# Patient Record
Sex: Male | Born: 1985 | Race: Black or African American | Hispanic: No | Marital: Single | Smoking: Never smoker
Health system: Southern US, Community
[De-identification: ages and names within clinical notes are randomized; demographics above are authoritative.]

---

## 2017-11-19 ENCOUNTER — Ambulatory Visit (HOSPITAL_COMMUNITY)
Admission: EM | Admit: 2017-11-19 | Discharge: 2017-11-19 | Disposition: A | Discharge status: Home / Self Care | Payer: BLUE CROSS/BLUE SHIELD | Attending: Family Medicine | Admitting: Family Medicine

## 2017-11-19 ENCOUNTER — Encounter (HOSPITAL_COMMUNITY): Payer: Self-pay | Admitting: Family Medicine

## 2017-11-19 DIAGNOSIS — R3912 Poor urinary stream: Secondary | ICD-10-CM | POA: Insufficient documentation

## 2017-11-19 DIAGNOSIS — R339 Retention of urine, unspecified: Secondary | ICD-10-CM

## 2017-11-19 DIAGNOSIS — N50811 Right testicular pain: Secondary | ICD-10-CM

## 2017-11-19 DIAGNOSIS — R3989 Other symptoms and signs involving the genitourinary system: Secondary | ICD-10-CM

## 2017-11-19 LAB — POCT URINALYSIS DIP (DEVICE)
Bilirubin Urine: NEGATIVE
GLUCOSE, UA: NEGATIVE mg/dL
Ketones, ur: NEGATIVE mg/dL
Leukocytes, UA: NEGATIVE
Nitrite: NEGATIVE
PH: 6 (ref 5.0–8.0)
PROTEIN: NEGATIVE mg/dL
UROBILINOGEN UA: 1 mg/dL (ref 0.0–1.0)

## 2017-11-19 LAB — PSA: Prostatic Specific Antigen: 0.82 ng/mL (ref 0.00–4.00)

## 2017-11-19 NOTE — ED Provider Notes (Signed)
[facility]    CSN: [id number] Arrival date & time: 11/19/17  [id number]     History   Chief Complaint Chief Complaint  Patient presents with  . Urinary Retention    HPI Dylan Kirk is a 31 y.o. male presenting with sensation of incomplete voiding, weaker stream and an odd sensation/pain in his right testicle. States symptoms have been occurring for 2 weeks and have been intermittent but have become more constant and more aware of symptoms of recently. Sexually active in relationship with 1 partner. Trying to have kids. No dysuria, no discharge. Endorsing lower back pain.    HPI  History reviewed. No pertinent past medical history.  There are no active problems to display for this patient.   History reviewed. No pertinent surgical history.     Home Medications    Prior to Admission medications   Not on File    Family History History reviewed. No pertinent family history.  Social History Social History   Tobacco Use  . Smoking status: Never Smoker  . Smokeless tobacco: Never Used  Substance Use Topics  . Alcohol use: Not on file  . Drug use: Not on file     Allergies   Patient has no known allergies.   Review of Systems Review of Systems  Constitutional: Negative for fever.  Gastrointestinal: Negative for diarrhea, nausea and vomiting.  Genitourinary: Positive for difficulty urinating and testicular pain. Negative for dysuria, hematuria, penile pain and scrotal swelling.  Musculoskeletal: Positive for back pain.  Neurological: Negative for dizziness, light-headedness and headaches.     Physical Exam Triage Vital Signs ED Triage Vitals  Enc Vitals Group     BP 11/19/17 1927 (!) 146/87     Pulse Rate 11/19/17 1927 85     Resp 11/19/17 1927 18     Temp 11/19/17 1927 98.4 F (36.9 C)     Temp src --      SpO2 11/19/17 1927 100 %     Weight --      Height --      Head Circumference --      Peak Flow --      Pain Score 11/19/17 1924  3     Pain Loc --      Pain Edu? --      Excl. in GC? --    No data found.  Updated Vital Signs BP (!) 146/87   Pulse 85   Temp 98.4 F (36.9 C)   Resp 18   SpO2 100%   Visual Acuity Right Eye Distance:   Left Eye Distance:   Bilateral Distance:    Right Eye Near:   Left Eye Near:    Bilateral Near:     Physical Exam  Constitutional: He is oriented to person, place, and time. He appears well-developed and well-nourished.  HENT:  Head: Normocephalic and atraumatic.  Eyes: Conjunctivae are normal.  Neck: Neck supple.  Cardiovascular: Normal rate and regular rhythm.  No murmur heard. Pulmonary/Chest: Effort normal and breath sounds normal. No respiratory distress.  Abdominal: Soft. There is no tenderness.  Genitourinary:  Genitourinary Comments: No penile discharge in urethra. No penile swelling or pain. No scrotal swelling, no tenderness to epididymitis. No masses in testicles.   Musculoskeletal: He exhibits no edema.  Neurological: He is alert and oriented to person, place, and time.  Skin: Skin is warm and dry.  Psychiatric: He has a normal mood and affect.  Nursing note and vitals reviewed.  UC Treatments / Results  Labs (all labs ordered are listed, but only abnormal results are displayed) Labs Reviewed  POCT URINALYSIS DIP (DEVICE) - Abnormal; Notable for the following components:      Result Value   Hgb urine dipstick MODERATE (*)    All other components within normal limits  URINE CULTURE  PSA  URINE CYTOLOGY ANCILLARY ONLY    EKG  EKG Interpretation None       Radiology No results found.  Procedures Procedures (including critical care time)  Medications Ordered in UC Medications - No data to display   Initial Impression / Assessment and Plan / UC Course  I have reviewed the triage vital signs and the nursing notes.  Pertinent labs & imaging results that were available during my care of the patient were reviewed by me and considered  in my medical decision making (see chart for details).     No evidence of infection on urine today. No evidence of torsion on exam. Checking for STD's and urine culture. PSA drawn. Follow up with urology.   Final Clinical Impressions(s) / UC Diagnoses   Final diagnoses:  Weak urinary stream  Urinary problem    ED Discharge Orders    None       Controlled Substance Prescriptions [place] Controlled Substance Registry consulted? Not Applicable   Foy GuadalajaraWieters, Skanda Worlds C, PA-C 11/19/17 2058    Lew DawesWieters, Shenay Torti C, PA-C 11/20/17 1007

## 2017-11-19 NOTE — Discharge Instructions (Signed)
Weak stream and sensation of incomplete voiding are concerning for prostate issues- we are checking for PSA  No evidence of infection on your urine today- we are checking for STD's and sending urine for culture to see if any bacteria come out.   We will call you to let you know if anything abnormal comes back on results.   Please follow up with urology for further evaluation.

## 2017-11-19 NOTE — ED Triage Notes (Signed)
Pt here with urinary retention. sts also pain in right testicle. Denies dysuria discharge or hematuria.

## 2017-11-20 LAB — URINE CYTOLOGY ANCILLARY ONLY
CHLAMYDIA, DNA PROBE: NEGATIVE
NEISSERIA GONORRHEA: NEGATIVE
TRICH (WINDOWPATH): NEGATIVE

## 2017-11-21 LAB — URINE CULTURE: Culture: NO GROWTH

## 2020-12-08 ENCOUNTER — Other Ambulatory Visit: Payer: BLUE CROSS/BLUE SHIELD

## 2021-04-16 ENCOUNTER — Observation Stay (HOSPITAL_BASED_OUTPATIENT_CLINIC_OR_DEPARTMENT_OTHER)
Admission: EM | Admit: 2021-04-16 | Discharge: 2021-04-18 | Disposition: A | Discharge status: Home / Self Care | Payer: No Typology Code available for payment source | Attending: Surgery | Admitting: Surgery

## 2021-04-16 ENCOUNTER — Emergency Department (HOSPITAL_BASED_OUTPATIENT_CLINIC_OR_DEPARTMENT_OTHER): Payer: No Typology Code available for payment source

## 2021-04-16 ENCOUNTER — Other Ambulatory Visit: Payer: Self-pay

## 2021-04-16 ENCOUNTER — Encounter (HOSPITAL_BASED_OUTPATIENT_CLINIC_OR_DEPARTMENT_OTHER): Payer: Self-pay

## 2021-04-16 DIAGNOSIS — K82A2 Perforation of gallbladder in cholecystitis: Secondary | ICD-10-CM | POA: Diagnosis not present

## 2021-04-16 DIAGNOSIS — K819 Cholecystitis, unspecified: Secondary | ICD-10-CM | POA: Diagnosis present

## 2021-04-16 DIAGNOSIS — R109 Unspecified abdominal pain: Secondary | ICD-10-CM

## 2021-04-16 DIAGNOSIS — K8000 Calculus of gallbladder with acute cholecystitis without obstruction: Secondary | ICD-10-CM | POA: Diagnosis not present

## 2021-04-16 DIAGNOSIS — Z20822 Contact with and (suspected) exposure to covid-19: Secondary | ICD-10-CM | POA: Insufficient documentation

## 2021-04-16 DIAGNOSIS — K810 Acute cholecystitis: Secondary | ICD-10-CM | POA: Diagnosis present

## 2021-04-16 LAB — CBC WITH DIFFERENTIAL/PLATELET
Abs Immature Granulocytes: 0.04 10*3/uL (ref 0.00–0.07)
Basophils Absolute: 0 10*3/uL (ref 0.0–0.1)
Basophils Relative: 0 %
Eosinophils Absolute: 0 10*3/uL (ref 0.0–0.5)
Eosinophils Relative: 0 %
HCT: 43.9 % (ref 39.0–52.0)
Hemoglobin: 14.5 g/dL (ref 13.0–17.0)
Immature Granulocytes: 0 %
Lymphocytes Relative: 11 %
Lymphs Abs: 1.9 10*3/uL (ref 0.7–4.0)
MCH: 30.2 pg (ref 26.0–34.0)
MCHC: 33 g/dL (ref 30.0–36.0)
MCV: 91.5 fL (ref 80.0–100.0)
Monocytes Absolute: 1.4 10*3/uL — ABNORMAL HIGH (ref 0.1–1.0)
Monocytes Relative: 8 %
Neutro Abs: 13.8 10*3/uL — ABNORMAL HIGH (ref 1.7–7.7)
Neutrophils Relative %: 81 %
Platelets: 325 10*3/uL (ref 150–400)
RBC: 4.8 MIL/uL (ref 4.22–5.81)
RDW: 13 % (ref 11.5–15.5)
WBC: 17.1 10*3/uL — ABNORMAL HIGH (ref 4.0–10.5)
nRBC: 0 % (ref 0.0–0.2)

## 2021-04-16 LAB — COMPREHENSIVE METABOLIC PANEL
ALT: 9 U/L (ref 0–44)
AST: 12 U/L — ABNORMAL LOW (ref 15–41)
Albumin: 4.4 g/dL (ref 3.5–5.0)
Alkaline Phosphatase: 65 U/L (ref 38–126)
Anion gap: 13 (ref 5–15)
BUN: 10 mg/dL (ref 6–20)
CO2: 25 mmol/L (ref 22–32)
Calcium: 9.3 mg/dL (ref 8.9–10.3)
Chloride: 103 mmol/L (ref 98–111)
Creatinine, Ser: 0.97 mg/dL (ref 0.61–1.24)
GFR, Estimated: >60 mL/min (ref >60)
Glucose, Bld: 113 mg/dL — ABNORMAL HIGH (ref 70–99)
Potassium: 3.2 mmol/L — ABNORMAL LOW (ref 3.5–5.1)
Sodium: 141 mmol/L (ref 135–145)
Total Bilirubin: 0.7 mg/dL (ref 0.3–1.2)
Total Protein: 7.1 g/dL (ref 6.5–8.1)

## 2021-04-16 LAB — URINALYSIS, ROUTINE W REFLEX MICROSCOPIC
Bilirubin Urine: NEGATIVE
Glucose, UA: NEGATIVE mg/dL
Ketones, ur: >80 mg/dL — AB
Leukocytes,Ua: NEGATIVE
Nitrite: NEGATIVE
Protein, ur: 30 mg/dL — AB
RBC / HPF: >50 RBC/hpf — ABNORMAL HIGH (ref 0–5)
Specific Gravity, Urine: 1.036 — ABNORMAL HIGH (ref 1.005–1.030)
pH: 6.5 (ref 5.0–8.0)

## 2021-04-16 LAB — LIPASE, BLOOD: Lipase: 14 U/L (ref 11–51)

## 2021-04-16 LAB — RESP PANEL BY RT-PCR (FLU A&B, COVID) ARPGX2
Influenza A by PCR: NEGATIVE
Influenza B by PCR: NEGATIVE
SARS Coronavirus 2 by RT PCR: NEGATIVE

## 2021-04-16 MED ORDER — HYDROMORPHONE HCL 1 MG/ML IJ SOLN
1.0000 mg | Freq: Once | INTRAMUSCULAR | Status: AC
  Administered 2021-04-16: 1 mg via INTRAVENOUS
  Filled 2021-04-16: qty 1

## 2021-04-16 MED ORDER — ONDANSETRON HCL 4 MG/2ML IJ SOLN
4.0000 mg | Freq: Once | INTRAMUSCULAR | Status: AC
  Administered 2021-04-16: 4 mg via INTRAVENOUS
  Filled 2021-04-16: qty 2

## 2021-04-16 MED ORDER — FAMOTIDINE IN NACL 20-0.9 MG/50ML-% IV SOLN
20.0000 mg | Freq: Once | INTRAVENOUS | Status: AC
  Administered 2021-04-16: 20 mg via INTRAVENOUS
  Filled 2021-04-16: qty 50

## 2021-04-16 MED ORDER — MORPHINE SULFATE (PF) 4 MG/ML IV SOLN
4.0000 mg | Freq: Once | INTRAVENOUS | Status: AC
  Administered 2021-04-16: 4 mg via INTRAVENOUS
  Filled 2021-04-16: qty 1

## 2021-04-16 MED ORDER — SODIUM CHLORIDE 0.9 % IV BOLUS
1000.0000 mL | Freq: Once | INTRAVENOUS | Status: AC
  Administered 2021-04-16: 1000 mL via INTRAVENOUS

## 2021-04-16 MED ORDER — SODIUM CHLORIDE 0.9 % IV SOLN
2.0000 g | Freq: Once | INTRAVENOUS | Status: AC
  Administered 2021-04-16: 2 g via INTRAVENOUS
  Filled 2021-04-16: qty 20

## 2021-04-16 NOTE — ED Notes (Signed)
[facility] present to transport patient. 

## 2021-04-16 NOTE — ED Notes (Signed)
Attempted to call report, I was told to call back in 10 minutes.

## 2021-04-16 NOTE — Progress Notes (Signed)
Patient has arrived to 6N18. Alert and oriented x4, able to make needs known. No SOB/DOB noted. 99%RA. T98.4 BP141/93 PR59 RR15 . 7/10 pain to abd- paged [facility] MD on call about patient's arrival. Patient was oriented to call bell and surroundings. Will cont. to monitor.

## 2021-04-16 NOTE — ED Triage Notes (Addendum)
Pt is present to the ED for generalized abd pain that radiates to RUQ x 24 hours. Pt states he has been so nauseous and has had to make himself throw up to get some relief. Pt states he also has a "hiccup fit" when he is sitting up straight.

## 2021-04-16 NOTE — ED Notes (Signed)
Ultrasound at bedside

## 2021-04-16 NOTE — ED Provider Notes (Signed)
[facility] EMERGENCY DEPT Provider Note   CSN: [id number] Arrival date & time: 04/16/21  1905     History Chief Complaint  Patient presents with  . Abdominal Pain  . Nausea    Dylan Kirk is a 35 y.o. male.  Pt presents to the ED today with abd pain and n/v.  Pt said he's been having intermittent abd pains and nausea for a few months.  He did see his pcp who did some labs and a plain Xray.  Those were nl, so he was referred to GI.  Pt started having severe abdominal pain with n/v after eating last night.  It was a family meal and no one else was sick.  No f/c.  He has intermittent "hiccup fits."         History reviewed. No pertinent past medical history.  There are no problems to display for this patient.   History reviewed. No pertinent surgical history.     No family history on file.  Social History   Tobacco Use  . Smoking status: Never Smoker  . Smokeless tobacco: Never Used    Home Medications Prior to Admission medications   Not on File    Allergies    Patient has no known allergies.  Review of Systems   Review of Systems  Gastrointestinal: Positive for abdominal pain, nausea and vomiting.  All other systems reviewed and are negative.   Physical Exam Updated Vital Signs BP (!) 154/82   Pulse (!) 52   Temp 98.4 F (36.9 C)   Resp 16   Ht 6\' 4"  (1.93 m)   Wt 130.2 kg   SpO2 100%   BMI 34.93 kg/m   Physical Exam Vitals and nursing note reviewed.  Constitutional:      Appearance: He is well-developed.  HENT:     Head: Normocephalic and atraumatic.     Mouth/Throat:     Mouth: Mucous membranes are moist.  Eyes:     Extraocular Movements: Extraocular movements intact.  Cardiovascular:     Rate and Rhythm: Normal rate and regular rhythm.     Heart sounds: Normal heart sounds.  Pulmonary:     Effort: Pulmonary effort is normal.     Breath sounds: Normal breath sounds.  Abdominal:     General: Abdomen is flat.      Palpations: Abdomen is soft.     Tenderness: There is abdominal tenderness in the right upper quadrant.  Skin:    Capillary Refill: Capillary refill takes less than 2 seconds.  Neurological:     General: No focal deficit present.     Mental Status: He is alert and oriented to person, place, and time.  Psychiatric:        Mood and Affect: Mood normal.        Behavior: Behavior normal.     ED Results / Procedures / Treatments   Labs (all labs ordered are listed, but only abnormal results are displayed) Labs Reviewed  CBC WITH DIFFERENTIAL/PLATELET - Abnormal; Notable for the following components:      Result Value   WBC 17.1 (*)    Neutro Abs 13.8 (*)    Monocytes Absolute 1.4 (*)    All other components within normal limits  COMPREHENSIVE METABOLIC PANEL - Abnormal; Notable for the following components:   Potassium 3.2 (*)    Glucose, Bld 113 (*)    AST 12 (*)    All other components within normal limits  RESP PANEL BY  RT-PCR (FLU A&B, COVID) ARPGX2  LIPASE, BLOOD  URINALYSIS, ROUTINE W REFLEX MICROSCOPIC    EKG None  Radiology US Abdomen Limited RUQ (LIVER/GB)  Result Date: 04/16/2021 CLINICAL DATA:  Right upper quadrant pain with nausea and vomiting for 24 hours EXAM: ULTRASOUND ABDOMEN LIMITED RIGHT UPPER QUADRANT COMPARISON:  None. FINDINGS: Gallbladder: There is gallbladder wall thickening measuring 0.8 cm. There are gallstones measuring up to 1.8 cm. There is sludge in the gallbladder. Positive sonographic Murphy sign. Mild pericholecystic fluid. Common bile duct: Diameter: 0.4 cm, within normal limits Liver: No focal lesion identified. Within normal limits in parenchymal echogenicity. Portal vein is patent on color Doppler imaging with normal direction of blood flow towards the liver. Other: None. IMPRESSION: Gallstones with gallbladder wall thickening and positive sonographic Murphy sign. Findings are concerning for acute cholecystitis in the appropriate clinical  setting. Electronically Signed   By: Emmaline Kluver M.D.   On: 04/16/2021 20:39    Procedures Procedures   Medications Ordered in ED Medications  cefTRIAXone (ROCEPHIN) 2 g in sodium chloride 0.9 % 100 mL IVPB (2 g Intravenous New Bag/Given 04/16/21 2101)  sodium chloride 0.9 % bolus 1,000 mL (1,000 mLs Intravenous New Bag/Given 04/16/21 2027)  ondansetron (ZOFRAN) injection 4 mg (4 mg Intravenous Given 04/16/21 2029)  morphine 4 MG/ML injection 4 mg (4 mg Intravenous Given 04/16/21 2029)  famotidine (PEPCID) IVPB 20 mg premix (0 mg Intravenous Stopped 04/16/21 2053)  HYDROmorphone (DILAUDID) injection 1 mg (1 mg Intravenous Given 04/16/21 2100)    ED Course  I have reviewed the triage vital signs and the nursing notes.  Pertinent labs & imaging results that were available during my care of the patient were reviewed by me and considered in my medical decision making (see chart for details).    MDM Rules/Calculators/A&P                          Pt does have acute cholecystitis.  He is given rocephin.  Covid swab is pending.  Pt d/w Dr. Bedelia Person (surgery) who will admit to [facility].   Final Clinical Impression(s) / ED Diagnoses Final diagnoses:  Abdominal pain  Acute cholecystitis    Rx / DC Orders ED Discharge Orders    None       Jacalyn Lefevre, MD 04/16/21 2110

## 2021-04-17 ENCOUNTER — Observation Stay (HOSPITAL_COMMUNITY): Payer: No Typology Code available for payment source | Admitting: Anesthesiology

## 2021-04-17 ENCOUNTER — Encounter (HOSPITAL_COMMUNITY)
Admission: EM | Disposition: A | Discharge status: Home / Self Care | Payer: Self-pay | Source: Home / Self Care | Attending: Emergency Medicine

## 2021-04-17 DIAGNOSIS — K810 Acute cholecystitis: Secondary | ICD-10-CM | POA: Diagnosis present

## 2021-04-17 HISTORY — PX: CHOLECYSTECTOMY: SHX55

## 2021-04-17 LAB — BASIC METABOLIC PANEL
Anion gap: 7 (ref 5–15)
BUN: 8 mg/dL (ref 6–20)
CO2: 30 mmol/L (ref 22–32)
Calcium: 8.9 mg/dL (ref 8.9–10.3)
Chloride: 102 mmol/L (ref 98–111)
Creatinine, Ser: 1.19 mg/dL (ref 0.61–1.24)
GFR, Estimated: >60 mL/min (ref >60)
Glucose, Bld: 111 mg/dL — ABNORMAL HIGH (ref 70–99)
Potassium: 3.4 mmol/L — ABNORMAL LOW (ref 3.5–5.1)
Sodium: 139 mmol/L (ref 135–145)

## 2021-04-17 LAB — CBC
HCT: 41.7 % (ref 39.0–52.0)
Hemoglobin: 13.7 g/dL (ref 13.0–17.0)
MCH: 30.2 pg (ref 26.0–34.0)
MCHC: 32.9 g/dL (ref 30.0–36.0)
MCV: 91.9 fL (ref 80.0–100.0)
Platelets: 315 10*3/uL (ref 150–400)
RBC: 4.54 MIL/uL (ref 4.22–5.81)
RDW: 12.9 % (ref 11.5–15.5)
WBC: 16.9 10*3/uL — ABNORMAL HIGH (ref 4.0–10.5)
nRBC: 0 % (ref 0.0–0.2)

## 2021-04-17 LAB — HIV ANTIBODY (ROUTINE TESTING W REFLEX): HIV Screen 4th Generation wRfx: NONREACTIVE

## 2021-04-17 LAB — SURGICAL PCR SCREEN
MRSA, PCR: NEGATIVE
Staphylococcus aureus: NEGATIVE

## 2021-04-17 SURGERY — LAPAROSCOPIC CHOLECYSTECTOMY
Anesthesia: General | Site: Abdomen

## 2021-04-17 MED ORDER — DOCUSATE SODIUM 100 MG PO CAPS
100.0000 mg | ORAL_CAPSULE | Freq: Two times a day (BID) | ORAL | Status: DC
  Administered 2021-04-17 – 2021-04-18 (×3): 100 mg via ORAL
  Filled 2021-04-17 (×3): qty 1

## 2021-04-17 MED ORDER — ONDANSETRON HCL 4 MG/2ML IJ SOLN
INTRAMUSCULAR | Status: DC | PRN
  Administered 2021-04-17: 4 mg via INTRAVENOUS

## 2021-04-17 MED ORDER — MORPHINE SULFATE (PF) 2 MG/ML IV SOLN
2.0000 mg | INTRAVENOUS | Status: DC | PRN
  Administered 2021-04-17 (×2): 2 mg via INTRAVENOUS
  Filled 2021-04-17 (×2): qty 1

## 2021-04-17 MED ORDER — ACETAMINOPHEN 10 MG/ML IV SOLN
INTRAVENOUS | Status: AC
  Filled 2021-04-17: qty 100

## 2021-04-17 MED ORDER — OXYCODONE-ACETAMINOPHEN 5-325 MG PO TABS: 1.0000 | ORAL_TABLET | ORAL | Status: DC | PRN

## 2021-04-17 MED ORDER — PROPOFOL 10 MG/ML IV BOLUS
INTRAVENOUS | Status: DC | PRN
  Administered 2021-04-17: 200 mg via INTRAVENOUS

## 2021-04-17 MED ORDER — LIDOCAINE 2% (20 MG/ML) 5 ML SYRINGE
INTRAMUSCULAR | Status: DC | PRN
  Administered 2021-04-17: 80 mg via INTRAVENOUS

## 2021-04-17 MED ORDER — ONDANSETRON 4 MG PO TBDP: 4.0000 mg | ORAL_TABLET | Freq: Four times a day (QID) | ORAL | Status: DC | PRN

## 2021-04-17 MED ORDER — BUPIVACAINE-EPINEPHRINE 0.25% -1:200000 IJ SOLN
INTRAMUSCULAR | Status: DC | PRN
  Administered 2021-04-17: 9 mL

## 2021-04-17 MED ORDER — SODIUM CHLORIDE 0.9 % IV SOLN
2.0000 g | INTRAVENOUS | Status: DC
  Administered 2021-04-17 (×2): 2 g via INTRAVENOUS
  Filled 2021-04-17: qty 2
  Filled 2021-04-17 (×2): qty 20

## 2021-04-17 MED ORDER — ENOXAPARIN SODIUM 40 MG/0.4ML IJ SOSY
40.0000 mg | PREFILLED_SYRINGE | INTRAMUSCULAR | Status: DC
  Administered 2021-04-17 – 2021-04-18 (×2): 40 mg via SUBCUTANEOUS
  Filled 2021-04-17 (×2): qty 0.4

## 2021-04-17 MED ORDER — ONDANSETRON HCL 4 MG/2ML IJ SOLN: 4.0000 mg | Freq: Four times a day (QID) | INTRAMUSCULAR | Status: DC | PRN

## 2021-04-17 MED ORDER — ROCURONIUM BROMIDE 10 MG/ML (PF) SYRINGE
PREFILLED_SYRINGE | INTRAVENOUS | Status: AC
  Filled 2021-04-17: qty 10

## 2021-04-17 MED ORDER — OXYCODONE HCL 5 MG PO TABS: 5.0000 mg | ORAL_TABLET | Freq: Once | ORAL | Status: AC | PRN

## 2021-04-17 MED ORDER — FENTANYL CITRATE (PF) 250 MCG/5ML IJ SOLN
INTRAMUSCULAR | Status: DC | PRN
  Administered 2021-04-17: 150 ug via INTRAVENOUS
  Administered 2021-04-17 (×2): 50 ug via INTRAVENOUS

## 2021-04-17 MED ORDER — DEXAMETHASONE SODIUM PHOSPHATE 10 MG/ML IJ SOLN
INTRAMUSCULAR | Status: DC | PRN
  Administered 2021-04-17: 8 mg via INTRAVENOUS

## 2021-04-17 MED ORDER — KETOROLAC TROMETHAMINE 15 MG/ML IJ SOLN
30.0000 mg | Freq: Four times a day (QID) | INTRAMUSCULAR | Status: DC
  Administered 2021-04-17 – 2021-04-18 (×7): 30 mg via INTRAVENOUS
  Filled 2021-04-17 (×7): qty 2

## 2021-04-17 MED ORDER — FENTANYL CITRATE (PF) 100 MCG/2ML IJ SOLN
INTRAMUSCULAR | Status: AC
  Filled 2021-04-17: qty 2

## 2021-04-17 MED ORDER — 0.9 % SODIUM CHLORIDE (POUR BTL) OPTIME
TOPICAL | Status: DC | PRN
  Administered 2021-04-17: 1000 mL

## 2021-04-17 MED ORDER — LIDOCAINE 2% (20 MG/ML) 5 ML SYRINGE
INTRAMUSCULAR | Status: AC
  Filled 2021-04-17: qty 5

## 2021-04-17 MED ORDER — DEXMEDETOMIDINE (PRECEDEX) IN NS 20 MCG/5ML (4 MCG/ML) IV SYRINGE
PREFILLED_SYRINGE | INTRAVENOUS | Status: DC | PRN
  Administered 2021-04-17: 12 ug via INTRAVENOUS
  Administered 2021-04-17: 4 ug via INTRAVENOUS
  Administered 2021-04-17 (×3): 8 ug via INTRAVENOUS

## 2021-04-17 MED ORDER — METHOCARBAMOL 750 MG PO TABS
750.0000 mg | ORAL_TABLET | Freq: Four times a day (QID) | ORAL | Status: DC
  Administered 2021-04-17 – 2021-04-18 (×5): 750 mg via ORAL
  Filled 2021-04-17 (×5): qty 1

## 2021-04-17 MED ORDER — PROPOFOL 10 MG/ML IV BOLUS
INTRAVENOUS | Status: AC
  Filled 2021-04-17: qty 20

## 2021-04-17 MED ORDER — OXYCODONE HCL 5 MG PO TABS
ORAL_TABLET | ORAL | Status: AC
  Administered 2021-04-17: 5 mg via ORAL
  Filled 2021-04-17: qty 1

## 2021-04-17 MED ORDER — ENOXAPARIN SODIUM 60 MG/0.6ML IJ SOSY: 60.0000 mg | PREFILLED_SYRINGE | INTRAMUSCULAR | Status: DC

## 2021-04-17 MED ORDER — SUGAMMADEX SODIUM 200 MG/2ML IV SOLN
INTRAVENOUS | Status: DC | PRN
  Administered 2021-04-17: 100 mg via INTRAVENOUS

## 2021-04-17 MED ORDER — FENTANYL CITRATE (PF) 100 MCG/2ML IJ SOLN
INTRAMUSCULAR | Status: AC
  Administered 2021-04-17: 50 ug via INTRAVENOUS
  Filled 2021-04-17: qty 2

## 2021-04-17 MED ORDER — DEXAMETHASONE SODIUM PHOSPHATE 10 MG/ML IJ SOLN
INTRAMUSCULAR | Status: AC
  Filled 2021-04-17: qty 1

## 2021-04-17 MED ORDER — ACETAMINOPHEN 500 MG PO TABS
1000.0000 mg | ORAL_TABLET | Freq: Four times a day (QID) | ORAL | Status: DC
  Administered 2021-04-17 – 2021-04-18 (×5): 1000 mg via ORAL
  Filled 2021-04-17 (×5): qty 2

## 2021-04-17 MED ORDER — LACTATED RINGERS IV SOLN: INTRAVENOUS | Status: DC

## 2021-04-17 MED ORDER — DEXMEDETOMIDINE (PRECEDEX) IN NS 20 MCG/5ML (4 MCG/ML) IV SYRINGE
PREFILLED_SYRINGE | INTRAVENOUS | Status: AC
  Filled 2021-04-17: qty 5

## 2021-04-17 MED ORDER — LACTATED RINGERS IV SOLN: INTRAVENOUS | Status: DC | PRN

## 2021-04-17 MED ORDER — FENTANYL CITRATE (PF) 250 MCG/5ML IJ SOLN
INTRAMUSCULAR | Status: AC
  Filled 2021-04-17: qty 5

## 2021-04-17 MED ORDER — OXYCODONE HCL 5 MG/5ML PO SOLN
5.0000 mg | ORAL | Status: DC | PRN
Start: 2021-04-17 — End: 2021-04-18
  Administered 2021-04-17 (×2): 10 mg via ORAL
  Administered 2021-04-18: 5 mg via ORAL
  Filled 2021-04-17 (×3): qty 10
  Filled 2021-04-17: qty 5

## 2021-04-17 MED ORDER — BUPIVACAINE-EPINEPHRINE (PF) 0.25% -1:200000 IJ SOLN
INTRAMUSCULAR | Status: AC
  Filled 2021-04-17: qty 30

## 2021-04-17 MED ORDER — ACETAMINOPHEN 10 MG/ML IV SOLN
INTRAVENOUS | Status: DC | PRN
  Administered 2021-04-17: 1000 mg via INTRAVENOUS

## 2021-04-17 MED ORDER — MIDAZOLAM HCL 2 MG/2ML IJ SOLN
INTRAMUSCULAR | Status: DC | PRN
  Administered 2021-04-17: 2 mg via INTRAVENOUS

## 2021-04-17 MED ORDER — ROCURONIUM BROMIDE 10 MG/ML (PF) SYRINGE
PREFILLED_SYRINGE | INTRAVENOUS | Status: DC | PRN
  Administered 2021-04-17: 20 mg via INTRAVENOUS
  Administered 2021-04-17: 50 mg via INTRAVENOUS

## 2021-04-17 MED ORDER — HEMOSTATIC AGENTS (NO CHARGE) OPTIME
TOPICAL | Status: DC | PRN
  Administered 2021-04-17: 1 via TOPICAL

## 2021-04-17 MED ORDER — OXYCODONE HCL 5 MG/5ML PO SOLN
5.0000 mg | Freq: Once | ORAL | Status: AC | PRN
Start: 2021-04-17 — End: 2021-04-17

## 2021-04-17 MED ORDER — ONDANSETRON HCL 4 MG/2ML IJ SOLN
INTRAMUSCULAR | Status: AC
  Filled 2021-04-17: qty 2

## 2021-04-17 MED ORDER — SODIUM CHLORIDE 0.9 % IR SOLN
Status: DC | PRN
  Administered 2021-04-17: 3000 mL

## 2021-04-17 MED ORDER — MIDAZOLAM HCL 2 MG/2ML IJ SOLN
INTRAMUSCULAR | Status: AC
  Filled 2021-04-17: qty 2

## 2021-04-17 MED ORDER — FENTANYL CITRATE (PF) 100 MCG/2ML IJ SOLN
25.0000 ug | INTRAMUSCULAR | Status: DC | PRN
  Administered 2021-04-17 (×2): 50 ug via INTRAVENOUS

## 2021-04-17 SURGICAL SUPPLY — 42 items
APPLIER CLIP ROT 10 11.4 M/L (STAPLE) ×2
BLADE CLIPPER SURG (BLADE) IMPLANT
CANISTER SUCT 3000ML PPV (MISCELLANEOUS) ×2 IMPLANT
CHLORAPREP W/TINT 26 (MISCELLANEOUS) ×2 IMPLANT
CLIP APPLIE ROT 10 11.4 M/L (STAPLE) ×1 IMPLANT
COVER SURGICAL LIGHT HANDLE (MISCELLANEOUS) ×2 IMPLANT
COVER WAND RF STERILE (DRAPES) IMPLANT
DERMABOND ADVANCED (GAUZE/BANDAGES/DRESSINGS) ×1
DERMABOND ADVANCED .7 DNX12 (GAUZE/BANDAGES/DRESSINGS) ×1 IMPLANT
ELECT REM PT RETURN 9FT ADLT (ELECTROSURGICAL) ×2
ELECTRODE REM PT RTRN 9FT ADLT (ELECTROSURGICAL) ×1 IMPLANT
GLOVE BIO SURGEON STRL SZ8 (GLOVE) ×2 IMPLANT
GLOVE SRG 8 PF TXTR STRL LF DI (GLOVE) ×1 IMPLANT
GLOVE SURG UNDER POLY LF SZ8 (GLOVE) ×1
GOWN STRL REUS W/ TWL LRG LVL3 (GOWN DISPOSABLE) ×2 IMPLANT
GOWN STRL REUS W/ TWL XL LVL3 (GOWN DISPOSABLE) ×1 IMPLANT
GOWN STRL REUS W/TWL LRG LVL3 (GOWN DISPOSABLE) ×2
GOWN STRL REUS W/TWL XL LVL3 (GOWN DISPOSABLE) ×1
IV NS 1000ML (IV SOLUTION) ×3
IV NS 1000ML BAXH (IV SOLUTION) ×3 IMPLANT
KIT BASIN OR (CUSTOM PROCEDURE TRAY) ×2 IMPLANT
KIT TURNOVER KIT B (KITS) ×2 IMPLANT
NS IRRIG 1000ML POUR BTL (IV SOLUTION) ×2 IMPLANT
PAD ARMBOARD 7.5X6 YLW CONV (MISCELLANEOUS) ×2 IMPLANT
POUCH RETRIEVAL ECOSAC 10 (ENDOMECHANICALS) ×1 IMPLANT
POUCH RETRIEVAL ECOSAC 10MM (ENDOMECHANICALS) ×1
POWDER SURGICEL 3.0 GRAM (HEMOSTASIS) ×2 IMPLANT
SCISSORS LAP 5X35 DISP (ENDOMECHANICALS) ×2 IMPLANT
SET IRRIG TUBING LAPAROSCOPIC (IRRIGATION / IRRIGATOR) ×2 IMPLANT
SET TUBE SMOKE EVAC HIGH FLOW (TUBING) ×2 IMPLANT
SLEEVE ENDOPATH XCEL 5M (ENDOMECHANICALS) ×2 IMPLANT
SPECIMEN JAR SMALL (MISCELLANEOUS) ×2 IMPLANT
SUT MNCRL AB 4-0 PS2 18 (SUTURE) ×2 IMPLANT
TIP ENDOSCOPIC SURGICEL (TIP) ×2 IMPLANT
TOWEL GREEN STERILE (TOWEL DISPOSABLE) ×2 IMPLANT
TOWEL GREEN STERILE FF (TOWEL DISPOSABLE) ×2 IMPLANT
TRAY LAPAROSCOPIC MC (CUSTOM PROCEDURE TRAY) ×2 IMPLANT
TROCAR XCEL BLUNT TIP 100MML (ENDOMECHANICALS) ×2 IMPLANT
TROCAR XCEL NON-BLD 11X100MML (ENDOMECHANICALS) ×2 IMPLANT
TROCAR XCEL NON-BLD 5MMX100MML (ENDOMECHANICALS) ×2 IMPLANT
WARMER LAPAROSCOPE (MISCELLANEOUS) ×2 IMPLANT
WATER STERILE IRR 1000ML POUR (IV SOLUTION) ×2 IMPLANT

## 2021-04-17 NOTE — Anesthesia Procedure Notes (Signed)
Procedure Name: Intubation Date/Time: 04/17/2021 8:11 AM Performed by: Modena Morrow, CRNA Pre-anesthesia Checklist: Patient identified, Emergency Drugs available, Suction available and Patient being monitored Patient Re-evaluated:Patient Re-evaluated prior to induction Oxygen Delivery Method: Circle system utilized Preoxygenation: Pre-oxygenation with 100% oxygen Induction Type: IV induction Ventilation: Mask ventilation without difficulty Laryngoscope Size: 3 Grade View: Grade I Tube type: Oral Tube size: 8.0 mm Number of attempts: 1 Airway Equipment and Method: Stylet and Oral airway Placement Confirmation: ETT inserted through vocal cords under direct vision,  positive ETCO2 and breath sounds checked- equal and bilateral Secured at: 24 cm Tube secured with: Tape Dental Injury: Teeth and Oropharynx as per pre-operative assessment

## 2021-04-17 NOTE — Op Note (Signed)
Laparoscopic Cholecystectomy  Procedure Note  Indications: This patient presents with symptomatic gallbladder disease and will undergo laparoscopic cholecystectomy.The procedure has been discussed with the patient. Operative and non operative treatments have been discussed. Risks of surgery include bleeding, infection,  Common bile duct injury,  Injury to the stomach,liver, colon,small intestine, abdominal wall,  Diaphragm,  Major blood vessels,  And the need for an open procedure.  Other risks include worsening of medical problems, death,  DVT and pulmonary embolism, and cardiovascular events.   Medical options have also been discussed. The patient has been informed of long term expectations of surgery and non surgical options,  The patient agrees to proceed.    Pre-operative Diagnosis: Calculus of gallbladder with acute cholecystitis, without mention of obstruction  Post-operative Diagnosis: Same  Surgeon: Dortha Schwalbe  MD   Assistants: OR staff  Anesthesia: General endotracheal anesthesia and Local anesthesia 0.25.% bupivacaine, with epinephrine  ASA Class: 2  Procedure Details  The patient was seen again in the Holding Room. The risks, benefits, complications, treatment options, and expected outcomes were discussed with the patient. The possibilities of reaction to medication, pulmonary aspiration, perforation of viscus, bleeding, recurrent infection, finding a normal gallbladder, the need for additional procedures, failure to diagnose a condition, the possible need to convert to an open procedure, and creating a complication requiring transfusion or operation were discussed with the patient. The patient and/or family concurred with the proposed plan, giving informed consent. The site of surgery properly noted/marked. The patient was taken to Operating Room, identified as Dylan Kirk and the procedure verified as Laparoscopic Cholecystectomy with Intraoperative Cholangiograms. A Time Out  was held and the above information confirmed.  Prior to the induction of general anesthesia, antibiotic prophylaxis was administered. General endotracheal anesthesia was then administered and tolerated well. After the induction, the abdomen was prepped in the usual sterile fashion. The patient was positioned in the supine position with the left arm comfortably tucked, along with some reverse Trendelenburg.  Local anesthetic agent was injected into the skin near the umbilicus and an incision made. The midline fascia was incised and the Hasson technique was used to introduce a 12 mm port under direct vision. It was secured with a figure of eight Vicryl suture placed in the usual fashion. Pneumoperitoneum was then created with CO2 and tolerated well without any adverse changes in the patient's vital signs. Additional trocars were introduced under direct vision with an 11 mm trocar in the epigastrium and 2 5 mm trocars in the right upper quadrant. All skin incisions were infiltrated with a local anesthetic agent before making the incision and placing the trocars.   The gallbladder was identified, the fundus grasped and retracted cephalad. Adhesions were lysed bluntly and with the electrocautery where indicated, taking care not to injure any adjacent organs or viscus. The gallbladder was decompressed since there was severe inflammation and necrosis.   The infundibulum was grasped and retracted laterally, exposing the peritoneum overlying the triangle of Calot. This was then divided and exposed in a blunt fashion. The cystic duct was clearly identified and bluntly dissected circumferentially. The junctions of the gallbladder, cystic duct and common bile duct were clearly identified prior to the division of any linear structure.   The cystic duct was extremely small and friable.  The critical view was obtained. CBD 4 mm and LFT s normal.  Cholangiogram not performed.    The cystic duct was then  ligated with  surgical clips  on the patient side  and  clipped on the gallbladder side and divided. The cystic artery was identified, dissected free, ligated with clips and divided as well. Posterior cystic artery clipped and divided.  The gallbladder was dissected from the liver bed in retrograde fashion with the electrocautery. The gallbladder was removed. The liver bed was irrigated and inspected. Hemostasis was achieved with the electrocautery. Copious irrigation was utilized and was repeatedly aspirated until clear all particulate matter. Hemostasis was achieved with cautery and laparoscopic Surgicel with no signs of bleeding or bile leakage.  Pneumoperitoneum was completely reduced after viewing removal of the trocars under direct vision. The wound was thoroughly irrigated and the fascia was then closed with a figure of eight suture; the skin was then closed with 4 0 monocryl  and a sterile dressing  of dermabond was applied.  Instrument, sponge, and needle counts were correct at closure and at the conclusion of the case.   Findings: Cholecystitis with Cholelithiasis  Estimated Blood Loss: less than 50 mL         Drains: none          Total IV Fluids: per record         Specimens: Gallbladder           Complications: None; patient tolerated the procedure well.         Disposition: PACU - hemodynamically stable.         Condition: stable

## 2021-04-17 NOTE — Interval H&P Note (Signed)
History and Physical Interval Note:  04/17/2021 7:55 AM  Dylan Kirk Setting  has presented today for surgery, with the diagnosis of Chlolecystitis.  The various methods of treatment have been discussed with the patient and family. After consideration of risks, benefits and other options for treatment, the patient has consented to  Procedure(s): LAPAROSCOPIC CHOLECYSTECTOMY (N/A) as a surgical intervention.  The patient's history has been reviewed, patient examined, no change in status, stable for surgery.  I have reviewed the patient's chart and labs.  Questions were answered to the patient's satisfaction.    Pt seen examined and agree.  The procedure has been discussed with the patient. Operative and non operative treatments have been discussed. Risks of surgery include bleeding, infection,  Common bile duct injury,  Injury to the stomach,liver, colon,small intestine, abdominal wall,  Diaphragm,  Major blood vessels,  And the need for an open procedure.  Other risks include worsening of medical problems, death,  DVT and pulmonary embolism, and cardiovascular events.   Medical options have also been discussed. The patient has been informed of long term expectations of surgery and non surgical options,  The patient agrees to proceed.     [name] A [name]

## 2021-04-17 NOTE — Discharge Instructions (Signed)
[facility] ° °Please arrive at least 30 min before your appointment to complete your check in paperwork.  If you are unable to arrive 30 min prior to your appointment time we may have to cancel or reschedule you. °LAPAROSCOPIC SURGERY: POST OP INSTRUCTIONS °Always review your discharge instruction sheet given to you by the facility where your surgery was performed. °IF YOU HAVE DISABILITY OR FAMILY LEAVE FORMS, YOU MUST BRING THEM TO THE OFFICE FOR PROCESSING.   °DO NOT GIVE THEM TO YOUR DOCTOR. ° °PAIN CONTROL ° °1. First take acetaminophen (Tylenol) AND/or ibuprofen (Advil) to control your pain after surgery.  Follow directions on package.  Taking acetaminophen (Tylenol) and/or ibuprofen (Advil) regularly after surgery will help to control your pain and lower the amount of prescription pain medication you may need.  You should not take more than 4,000 mg (4 grams) of acetaminophen (Tylenol) in 24 hours.  You should not take ibuprofen (Advil), aleve, motrin, naprosyn or other NSAIDS if you have a history of stomach ulcers or chronic kidney disease.  °2. A prescription for pain medication may be given to you upon discharge.  Take your pain medication as prescribed, if you still have uncontrolled pain after taking acetaminophen (Tylenol) or ibuprofen (Advil). °3. Use ice packs to help control pain. °4. If you need a refill on your pain medication, please contact your pharmacy.  They will contact our office to request authorization. Prescriptions will not be filled after 5pm or on week-ends. ° °HOME MEDICATIONS °5. Take your usually prescribed medications unless otherwise directed. ° °DIET °6. You should follow a light diet the first few days after arrival home.  Be sure to include lots of fluids daily. Avoid fatty, fried foods.  ° °CONSTIPATION °7. It is common to experience some constipation after surgery and if you are taking pain medication.  Increasing fluid intake and taking a stool  softener (such as Colace) will usually help or prevent this problem from occurring.  A mild laxative (Milk of Magnesia or Miralax) should be taken according to package instructions if there are no bowel movements after 48 hours. ° °WOUND/INCISION CARE °8. Most patients will experience some swelling and bruising in the area of the incisions.  Ice packs will help.  Swelling and bruising can take several days to resolve.  °9. Unless discharge instructions indicate otherwise, follow guidelines below  °a. STERI-STRIPS - you may remove your outer bandages 48 hours after surgery, and you may shower at that time.  You have steri-strips (small skin tapes) in place directly over the incision.  These strips should be left on the skin for 7-10 days.   °b. DERMABOND/SKIN GLUE - you may shower in 24 hours.  The glue will flake off over the next 2-3 weeks. °10. Any sutures or staples will be removed at the office during your follow-up visit. ° °ACTIVITIES °11. You may resume regular (light) daily activities beginning the next day--such as daily self-care, walking, climbing stairs--gradually increasing activities as tolerated.  You may have sexual intercourse when it is comfortable.  Refrain from any heavy lifting or straining until approved by your doctor. °a. You may drive when you are no longer taking prescription pain medication, you can comfortably wear a seatbelt, and you can safely maneuver your car and apply brakes. ° °FOLLOW-UP °12. You should see your doctor in the office for a follow-up appointment approximately 2-3 weeks after your surgery.  You should have been given your post-op/follow-up appointment when   your surgery was scheduled.  If you did not receive a post-op/follow-up appointment, make sure that you call for this appointment within a day or two after you arrive home to insure a convenient appointment time. ° °OTHER INSTRUCTIONS ° °WHEN TO CALL YOUR DOCTOR: °1. Fever over 101.0 °2. Inability to  urinate °3. Continued bleeding from incision. °4. Increased pain, redness, or drainage from the incision. °5. Increasing abdominal pain ° °The clinic staff is available to answer your questions during regular business hours.  Please don’t hesitate to call and ask to speak to one of the nurses for clinical concerns.  If you have a medical emergency, go to the nearest emergency room or call 911.  A surgeon from [facility] is always on call at the hospital. °[address], [address], [place], [place]  [address] ? [address] [address], [place], [place]   [address] °[phone] ? [phone] ? FAX [phone] ° ° ° °

## 2021-04-17 NOTE — H&P (Addendum)
Reason for Consult/Chief Complaint: acute cholecystitis Consultant: Particia Nearing, MD  Dylan Kirk is an 35 y.o. male.   HPI: 36M with abdominal pain x24h presented to [facility]. Reports associated nausea, belching, hiccuping. Sx improved with self-induced emesis. Reports vomitus dark brown in color. Denies fevers. No BM since 5/13.   History reviewed. No pertinent past medical history.  History reviewed. No pertinent surgical history.  No family history on file.  Social History:  reports that he has never smoked. He has never used smokeless tobacco. No history on file for alcohol use and drug use.  Allergies: No Known Allergies  Medications: I have reviewed the patient's current medications.  Results for orders placed or performed during the hospital encounter of 04/16/21 (from the past 48 hour(s))  Urinalysis, Routine w reflex microscopic     Status: Abnormal   Collection Time: 04/16/21  7:20 PM  Result Value Ref Range   Color, Urine YELLOW YELLOW   APPearance CLEAR CLEAR   Specific Gravity, Urine 1.036 (H) 1.005 - 1.030   pH 6.5 5.0 - 8.0   Glucose, UA NEGATIVE NEGATIVE mg/dL   Hgb urine dipstick MODERATE (A) NEGATIVE   Bilirubin Urine NEGATIVE NEGATIVE   Ketones, ur >80 (A) NEGATIVE mg/dL   Protein, ur 30 (A) NEGATIVE mg/dL   Nitrite NEGATIVE NEGATIVE   Leukocytes,Ua NEGATIVE NEGATIVE   RBC / HPF >50 (H) 0 - 5 RBC/hpf   WBC, UA 0-5 0 - 5 WBC/hpf   Squamous Epithelial / LPF 0-5 0 - 5   Mucus PRESENT     Comment: Performed at [facility], [address] [place], [place] [address]  CBC with Differential     Status: Abnormal   Collection Time: 04/16/21  8:15 PM  Result Value Ref Range   WBC 17.1 (H) 4.0 - 10.5 K/uL   RBC 4.80 4.22 - 5.81 MIL/uL   Hemoglobin 14.5 13.0 - 17.0 g/dL   HCT 01.0 93.2 - 35.5 %   MCV 91.5 80.0 - 100.0 fL   MCH 30.2 26.0 - 34.0 pg   MCHC 33.0 30.0 - 36.0 g/dL   RDW 73.2 20.2 - 54.2 %   Platelets 325 150 -  400 K/uL   nRBC 0.0 0.0 - 0.2 %   Neutrophils Relative % 81 %   Neutro Abs 13.8 (H) 1.7 - 7.7 K/uL   Lymphocytes Relative 11 %   Lymphs Abs 1.9 0.7 - 4.0 K/uL   Monocytes Relative 8 %   Monocytes Absolute 1.4 (H) 0.1 - 1.0 K/uL   Eosinophils Relative 0 %   Eosinophils Absolute 0.0 0.0 - 0.5 K/uL   Basophils Relative 0 %   Basophils Absolute 0.0 0.0 - 0.1 K/uL   Immature Granulocytes 0 %   Abs Immature Granulocytes 0.04 0.00 - 0.07 K/uL    Comment: Performed at [facility], [address], [place], [place] [address]  Comprehensive metabolic panel     Status: Abnormal   Collection Time: 04/16/21  8:15 PM  Result Value Ref Range   Sodium 141 135 - 145 mmol/L   Potassium 3.2 (L) 3.5 - 5.1 mmol/L   Chloride 103 98 - 111 mmol/L   CO2 25 22 - 32 mmol/L   Glucose, Bld 113 (H) 70 - 99 mg/dL    Comment: Glucose reference range applies only to samples taken after fasting for at least 8 hours.   BUN 10 6 - 20 mg/dL   Creatinine, Ser 7.62 0.61 - 1.24  mg/dL   Calcium 9.3 8.9 - 67.3 mg/dL   Total Protein 7.1 6.5 - 8.1 g/dL   Albumin 4.4 3.5 - 5.0 g/dL   AST 12 (L) 15 - 41 U/L   ALT 9 0 - 44 U/L   Alkaline Phosphatase 65 38 - 126 U/L   Total Bilirubin 0.7 0.3 - 1.2 mg/dL   GFR, Estimated >41 >93 mL/min    Comment: (NOTE) Calculated using the CKD-EPI Creatinine Equation (2021)    Anion gap 13 5 - 15    Comment: Performed at [facility], [address] [place], [place] [address]  Lipase, blood     Status: None   Collection Time: 04/16/21  8:15 PM  Result Value Ref Range   Lipase 14 11 - 51 U/L    Comment: Performed at [facility], [address] [place], [place] [address]  Resp Panel by RT-PCR (Flu A&B, Covid) Nasopharyngeal Swab     Status: None   Collection Time: 04/16/21  8:46 PM   Specimen: Nasopharyngeal Swab; Nasopharyngeal(NP) swabs in vial transport medium  Result Value Ref Range   SARS Coronavirus 2 by RT PCR  NEGATIVE NEGATIVE    Comment: (NOTE) SARS-CoV-2 target nucleic acids are NOT DETECTED.  The SARS-CoV-2 RNA is generally detectable in upper respiratory specimens during the acute phase of infection. The lowest concentration of SARS-CoV-2 viral copies this assay can detect is 138 copies/mL. A negative result does not preclude SARS-Cov-2 infection and should not be used as the sole basis for treatment or other patient management decisions. A negative result may occur with  improper specimen collection/handling, submission of specimen other than nasopharyngeal swab, presence of viral mutation(s) within the areas targeted by this assay, and inadequate number of viral copies(<138 copies/mL). A negative result must be combined with clinical observations, patient history, and epidemiological information. The expected result is Negative.  Fact Sheet for Patients:  BloggerCourse.com  Fact Sheet for Healthcare Providers:  SeriousBroker.it  This test is no t yet approved or cleared by the [place] FDA and  has been authorized for detection and/or diagnosis of SARS-CoV-2 by FDA under an Emergency Use Authorization (EUA). This EUA will remain  in effect (meaning this test can be used) for the duration of the COVID-19 declaration under Section 564(b)(1) of the Act, 21 U.S.C.section 360bbb-3(b)(1), unless the authorization is terminated  or revoked sooner.       Influenza A by PCR NEGATIVE NEGATIVE   Influenza B by PCR NEGATIVE NEGATIVE    Comment: (NOTE) The Xpert Xpress SARS-CoV-2/FLU/RSV plus assay is intended as an aid in the diagnosis of influenza from Nasopharyngeal swab specimens and should not be used as a sole basis for treatment. Nasal washings and aspirates are unacceptable for Xpert Xpress SARS-CoV-2/FLU/RSV testing.  Fact Sheet for Patients: BloggerCourse.com  Fact Sheet for Healthcare  Providers: SeriousBroker.it  This test is not yet approved or cleared by the [place] FDA and has been authorized for detection and/or diagnosis of SARS-CoV-2 by FDA under an Emergency Use Authorization (EUA). This EUA will remain in effect (meaning this test can be used) for the duration of the COVID-19 declaration under Section 564(b)(1) of the Act, 21 U.S.C. section 360bbb-3(b)(1), unless the authorization is terminated or revoked.  Performed at [facility], [address], [place], [place] [address]     US Abdomen Limited RUQ (LIVER/GB)  Result Date: 04/16/2021 CLINICAL DATA:  Right upper quadrant pain with nausea and vomiting for 24 hours EXAM:  ULTRASOUND ABDOMEN LIMITED RIGHT UPPER QUADRANT COMPARISON:  None. FINDINGS: Gallbladder: There is gallbladder wall thickening measuring 0.8 cm. There are gallstones measuring up to 1.8 cm. There is sludge in the gallbladder. Positive sonographic Murphy sign. Mild pericholecystic fluid. Common bile duct: Diameter: 0.4 cm, within normal limits Liver: No focal lesion identified. Within normal limits in parenchymal echogenicity. Portal vein is patent on color Doppler imaging with normal direction of blood flow towards the liver. Other: None. IMPRESSION: Gallstones with gallbladder wall thickening and positive sonographic Murphy sign. Findings are concerning for acute cholecystitis in the appropriate clinical setting. Electronically Signed   By: Emmaline Kluver M.D.   On: 04/16/2021 20:39    ROS 10 point review of systems is negative except as listed above in HPI.   Physical Exam Blood pressure (!) 141/93, pulse (!) 59, temperature 98.4 F (36.9 C), resp. rate 15, height 6\' 4"  (1.93 m), weight 130.2 kg, SpO2 99 %. Constitutional: well-developed, well-nourished HEENT: pupils equal, round, reactive to light, 86mm b/l, moist conjunctiva, external inspection of ears and nose normal, hearing  intact Oropharynx: normal oropharyngeal mucosa, normal dentition Neck: no thyromegaly, trachea midline, no midline cervical tenderness to palpation Chest: breath sounds equal bilaterally, normal respiratory effort, no midline or lateral chest wall tenderness to palpation/deformity Abdomen: soft, RUQ TTP, no bruising, no hepatosplenomegaly GU: no blood at urethral meatus of penis, no scrotal masses or abnormality  Back: no wounds, no thoracic/lumbar spine tenderness to palpation, no thoracic/lumbar spine stepoffs Rectal: deferred Extremities: 2+ radial and pedal pulses bilaterally, motor and sensation intact to bilateral UE and LE, no peripheral edema MSK: normal gait/station, no clubbing/cyanosis of fingers/toes, normal ROM of all four extremities Skin: warm, dry, no rashes Psych: normal memory, normal mood/affect    Assessment/Plan: 29M with acute cholecystitis. Plan for lap chole in AM by Dr. 3m. Anatomy and pathophysiology clearly explained, including the use of pictures. Informed consent was obtained after detailed explanation of risks, including bleeding, infection, biloma, hematoma, need for IOC to delineate anatomy, and need for conversion to open procedure. All questions answered to the patient's satisfaction.   Luisa Hart, MD General and Trauma Surgery [facility]

## 2021-04-17 NOTE — Anesthesia Preprocedure Evaluation (Signed)
Anesthesia Evaluation  Patient identified by MRN, date of birth, ID band Patient awake    Reviewed: Allergy & Precautions, H&P , NPO status , Patient's Chart, lab work & pertinent test results  Airway Mallampati: II   Neck ROM: full    Dental   Pulmonary neg pulmonary ROS,    breath sounds clear to auscultation       Cardiovascular negative cardio ROS   Rhythm:regular Rate:Normal     Neuro/Psych    GI/Hepatic cholecystitis   Endo/Other  obese  Renal/GU      Musculoskeletal   Abdominal   Peds  Hematology   Anesthesia Other Findings   Reproductive/Obstetrics                             Anesthesia Physical Anesthesia Plan  ASA: II  Anesthesia Plan: General   Post-op Pain Management:    Induction: Intravenous  PONV Risk Score and Plan: 2 and Ondansetron, Dexamethasone, Midazolam and Treatment may vary due to age or medical condition  Airway Management Planned: Oral ETT  Additional Equipment:   Intra-op Plan:   Post-operative Plan: Extubation in OR  Informed Consent: I have reviewed the patients History and Physical, chart, labs and discussed the procedure including the risks, benefits and alternatives for the proposed anesthesia with the patient or authorized representative who has indicated his/her understanding and acceptance.     Dental advisory given  Plan Discussed with: CRNA, Anesthesiologist and Surgeon  Anesthesia Plan Comments:         Anesthesia Quick Evaluation  

## 2021-04-17 NOTE — Progress Notes (Signed)
Patient transferred to OR. Report given to [facility].

## 2021-04-17 NOTE — Transfer of Care (Signed)
Immediate Anesthesia Transfer of Care Note  Patient: Dylan Kirk  Procedure(s) Performed: LAPAROSCOPIC CHOLECYSTECTOMY (N/A Abdomen)  Patient Location: PACU  Anesthesia Type:General  Level of Consciousness: drowsy and patient cooperative  Airway & Oxygen Therapy: Patient Spontanous Breathing and Patient connected to face mask oxygen  Post-op Assessment: Report given to RN and Post -op Vital signs reviewed and stable  Post vital signs: Reviewed and stable  Last Vitals:  Vitals Value Taken Time  BP 152/85 04/17/21 0952  Temp 36.2 C 04/17/21 0952  Pulse 64 04/17/21 0954  Resp 22 04/17/21 0954  SpO2 100 % 04/17/21 0954  Vitals shown include unvalidated device data.  Last Pain:  Vitals:   04/17/21 0952  TempSrc:   PainSc: 0-No pain      Patients Stated Pain Goal: 3 (04/17/21 0056)  Complications: No complications documented.

## 2021-04-17 NOTE — Plan of Care (Signed)
  Problem: Education: Goal: Knowledge of General Education information will improve Description: Including pain rating scale, medication(s)/side effects and non-pharmacologic comfort measures Outcome: Progressing   Problem: Health Behavior/Discharge Planning: Goal: Ability to manage health-related needs will improve Outcome: Progressing   Problem: Education: Goal: Knowledge of General Education information will improve Description: Including pain rating scale, medication(s)/side effects and non-pharmacologic comfort measures Outcome: Progressing   Problem: Clinical Measurements: Goal: Ability to maintain clinical measurements within normal limits will improve Outcome: Progressing Goal: Will remain free from infection Outcome: Progressing Goal: Diagnostic test results will improve Outcome: Progressing Goal: Respiratory complications will improve Outcome: Progressing Goal: Cardiovascular complication will be avoided Outcome: Progressing   Problem: Health Behavior/Discharge Planning: Goal: Ability to manage health-related needs will improve Outcome: Progressing   Problem: Education: Goal: Knowledge of General Education information will improve Description: Including pain rating scale, medication(s)/side effects and non-pharmacologic comfort measures Outcome: Progressing

## 2021-04-18 ENCOUNTER — Encounter (HOSPITAL_COMMUNITY): Payer: Self-pay | Admitting: Surgery

## 2021-04-18 LAB — SURGICAL PATHOLOGY

## 2021-04-18 MED ORDER — OXYCODONE HCL 5 MG PO TABS: 5.0000 mg | ORAL_TABLET | Freq: Four times a day (QID) | ORAL | 0 refills | Status: AC | PRN

## 2021-04-18 MED ORDER — AMOXICILLIN-POT CLAVULANATE 875-125 MG PO TABS: 1.0000 | ORAL_TABLET | Freq: Two times a day (BID) | ORAL | 0 refills | Status: AC

## 2021-04-18 MED ORDER — ACETAMINOPHEN 500 MG PO TABS: 1000.0000 mg | ORAL_TABLET | Freq: Four times a day (QID) | ORAL | 0 refills | Status: AC | PRN

## 2021-04-18 MED ORDER — POLYETHYLENE GLYCOL 3350 17 G PO PACK: 17.0000 g | PACK | Freq: Every day | ORAL | 0 refills | Status: AC | PRN

## 2021-04-18 MED ORDER — OXYCODONE HCL 5 MG PO TABS: 5.0000 mg | ORAL_TABLET | ORAL | Status: DC | PRN

## 2021-04-18 MED ORDER — MORPHINE SULFATE (PF) 2 MG/ML IV SOLN: 2.0000 mg | INTRAVENOUS | Status: DC | PRN

## 2021-04-18 NOTE — Anesthesia Postprocedure Evaluation (Signed)
Anesthesia Post Note  Patient: Dylan Kirk  Procedure(s) Performed: LAPAROSCOPIC CHOLECYSTECTOMY (N/A Abdomen)     Patient location during evaluation: PACU Anesthesia Type: General Level of consciousness: awake and alert Pain management: pain level controlled Vital Signs Assessment: post-procedure vital signs reviewed and stable Respiratory status: spontaneous breathing, nonlabored ventilation, respiratory function stable and patient connected to nasal cannula oxygen Cardiovascular status: blood pressure returned to baseline and stable Postop Assessment: no apparent nausea or vomiting Anesthetic complications: no   No complications documented.  Last Vitals:  Vitals:   04/18/21 0425 04/18/21 0836  BP: (!) 142/72 (!) 147/86  Pulse: (!) 54 81  Resp: 18 18  Temp: 37 C 36.9 C  SpO2: 94% 98%    Last Pain:  Vitals:   04/18/21 0900  TempSrc:   PainSc: 7                  Graelyn Bihl S

## 2021-04-18 NOTE — Discharge Summary (Signed)
[facility] Discharge Summary   Patient ID: Dylan Kirk MRN: [id number] DOB/AGE: March 26, 1986 35 y.o.  Admit date: 04/16/2021 Discharge date: 04/18/2021  Admitting Diagnosis: Acute cholecystitis   Discharge Diagnosis Patient Active Problem List   Diagnosis Date Noted  . Acute cholecystitis 04/17/2021  . Cholecystitis 04/16/2021    Consultants None  Imaging: US Abdomen Limited RUQ (LIVER/GB)  Result Date: 04/16/2021 CLINICAL DATA:  Right upper quadrant pain with nausea and vomiting for 24 hours EXAM: ULTRASOUND ABDOMEN LIMITED RIGHT UPPER QUADRANT COMPARISON:  None. FINDINGS: Gallbladder: There is gallbladder wall thickening measuring 0.8 cm. There are gallstones measuring up to 1.8 cm. There is sludge in the gallbladder. Positive sonographic Murphy sign. Mild pericholecystic fluid. Common bile duct: Diameter: 0.4 cm, within normal limits Liver: No focal lesion identified. Within normal limits in parenchymal echogenicity. Portal vein is patent on color Doppler imaging with normal direction of blood flow towards the liver. Other: None. IMPRESSION: Gallstones with gallbladder wall thickening and positive sonographic Murphy sign. Findings are concerning for acute cholecystitis in the appropriate clinical setting. Electronically Signed   By: Emmaline Kluver M.D.   On: 04/16/2021 20:39    Procedures Dr. Luisa Hart (04/17/2021) - Laparoscopic Cholecystectomy  Hospital Course:  Dylan Kirk is a 35yo male who was transferred from [facility] to [facility] 04/16/21 with complaints of 24 hours of abdominal pain, nausea, belching, and hiccuping.  Workup showed acute cholecystitis.  Patient was admitted and underwent procedure listed above.  Intraoperatively his gallbladder was found to have severe inflammation and necrosis. Tolerated procedure well and was transferred to the floor.  Diet was advanced as tolerated.  He was kept on IV rocephin during admission and discharged home  with 7 days of augmentin. On POD2 the patient was voiding well, tolerating diet, ambulating well, pain well controlled, vital signs stable, incisions c/d/i and felt stable for discharge home.  Patient will follow up as below and knows to call with questions or concerns.    I have personally reviewed the patients medication history on the [place] controlled substance database.    Physical Exam: General:  Alert, NAD, pleasant, comfortable Pulm: rate and effort normal Abd:  Soft, ND, appropriately tender, multiple lap incisions C/D/I  Allergies as of 04/18/2021   No Known Allergies     Medication List    TAKE these medications   acetaminophen 500 MG tablet Commonly known as: TYLENOL Take 2 tablets (1,000 mg total) by mouth every 6 (six) hours as needed for mild pain.   amoxicillin-clavulanate 875-125 MG tablet Commonly known as: Augmentin Take 1 tablet by mouth 2 (two) times daily for 7 days.   oxyCODONE 5 MG immediate release tablet Commonly known as: Oxy IR/ROXICODONE Take 1 tablet (5 mg total) by mouth every 6 (six) hours as needed for moderate pain or severe pain.   polyethylene glycol 17 g packet Commonly known as: MiraLax Take 17 g by mouth daily as needed for mild constipation.         Follow-up Information    [facility], [place]. Go on 05/09/2021.   Specialty: General Surgery Why: Your appointment is 05/09/21 at 8:45 am  Please arrive 30 minutes prior to your appointment to check in and fill out paperwork. Bring photo ID and Insurance account manager information: [address] [place] [address] [phone]              Signed: Franne Forts, [facility] 04/18/2021, 9:42 AM Please see  Amion for pager number during day hours 7:00am-4:30pm

## 2021-05-16 IMAGING — US US ABDOMEN LIMITED RUQ/ASCITES
1 series · 14 of 25 positions shown · non-contrast
Comparison: None.

CLINICAL DATA: Right upper quadrant pain with nausea and vomiting
for 24 hours

EXAM:
ULTRASOUND ABDOMEN LIMITED RIGHT UPPER QUADRANT

[Series 1: us abdomen limited ruq (liver/gb) · 14 of 76 slices shown]
[im 1/76]
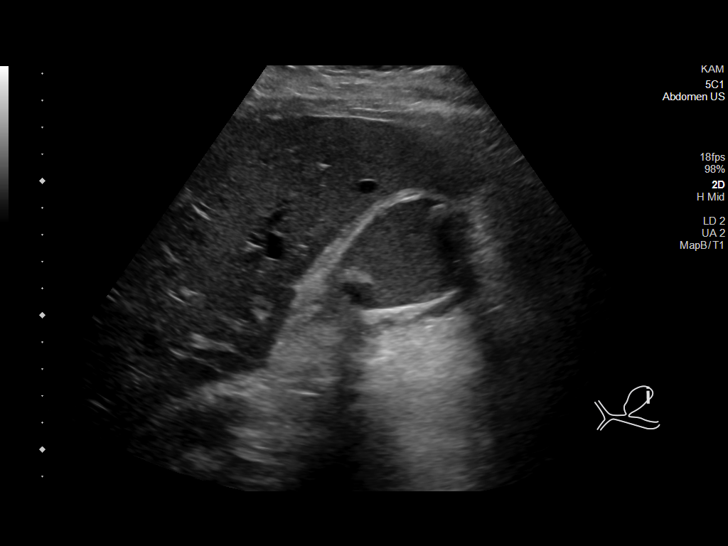
[im 7/76]
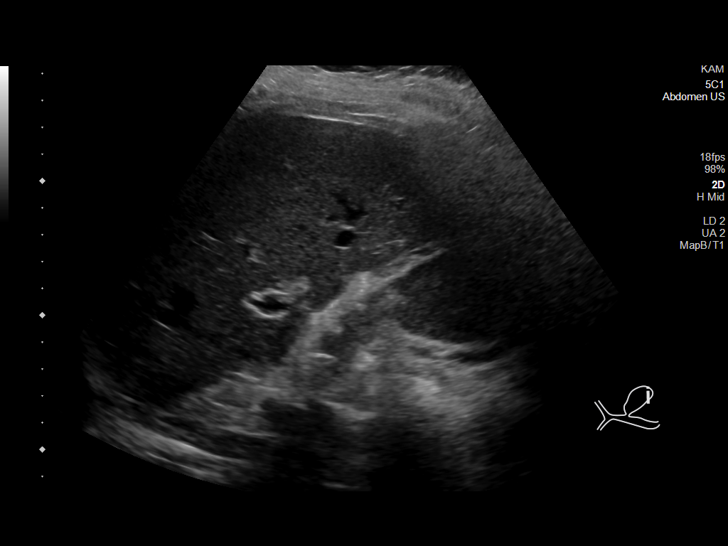
[im 13/76]
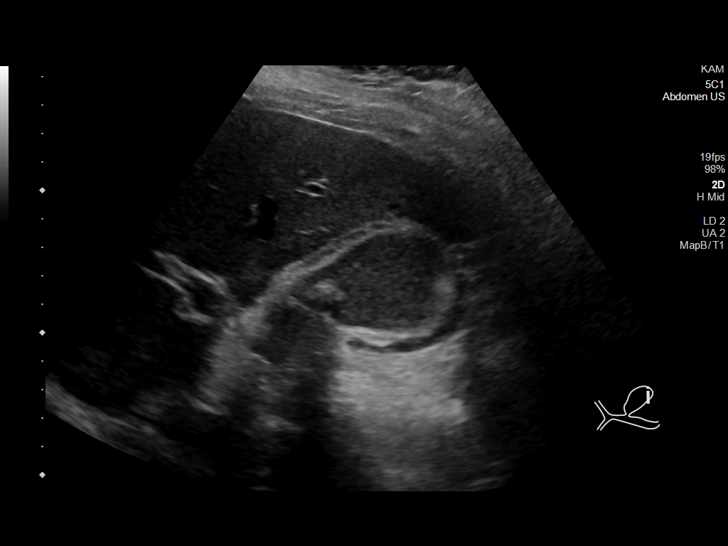
[im 19/76]
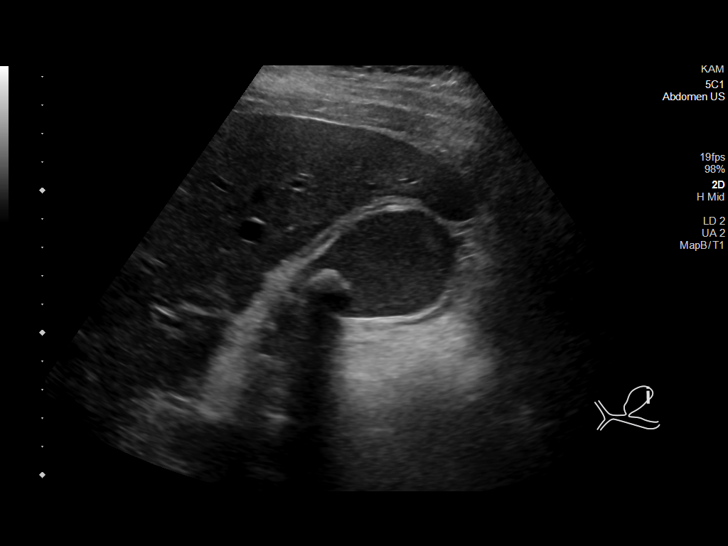
[im 26/76]
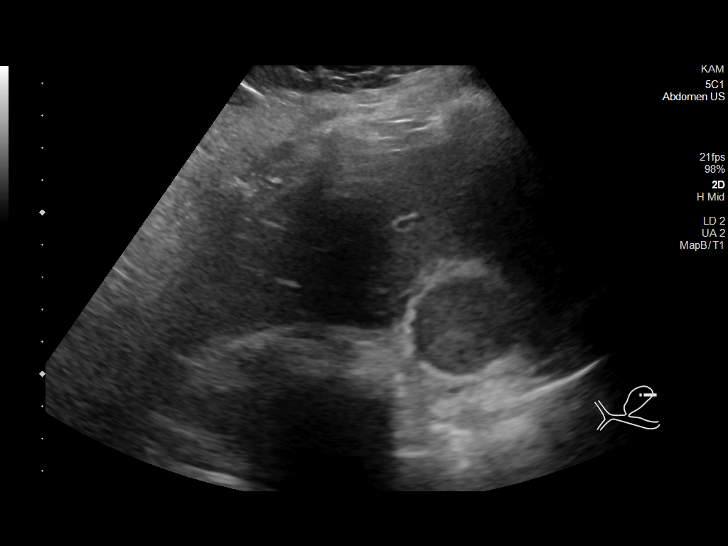
[im 29/76]
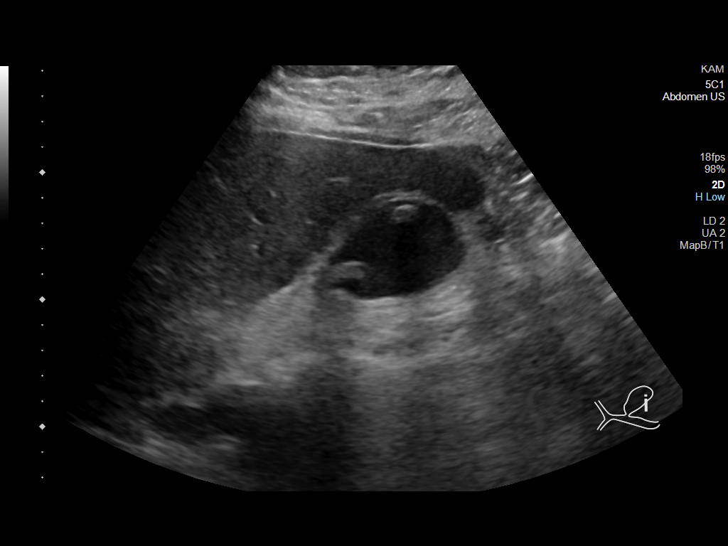
[im 35/76]
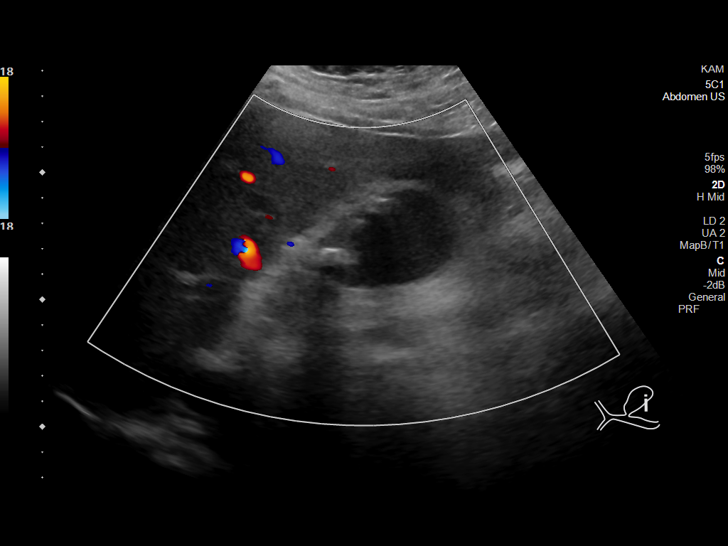
[im 41/76]
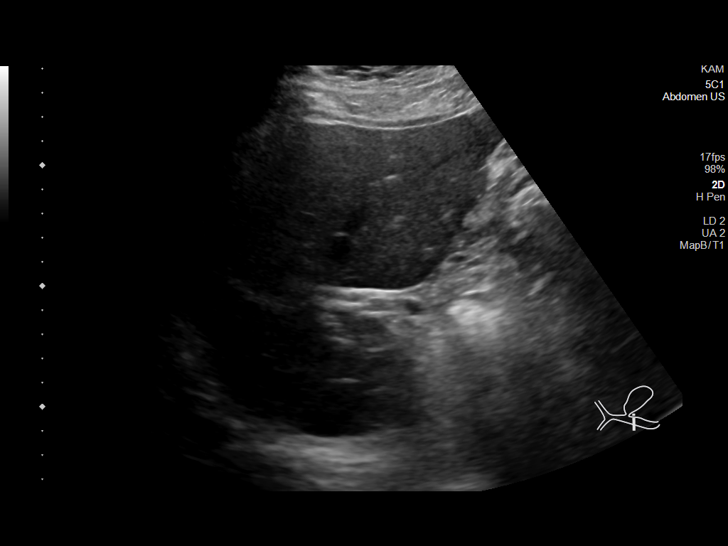
[im 47/76]
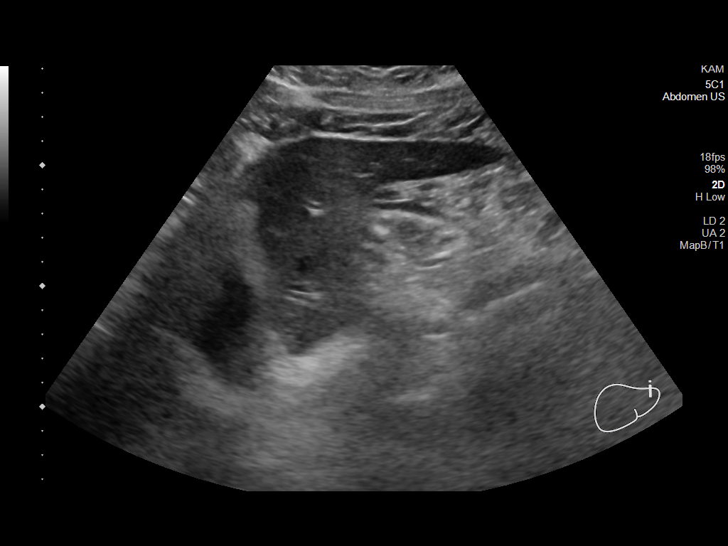
[im 51/76]
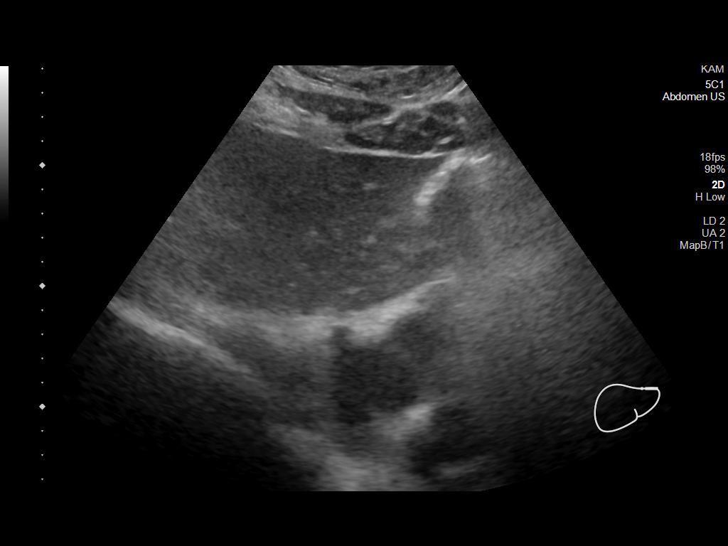
[im 57/76]
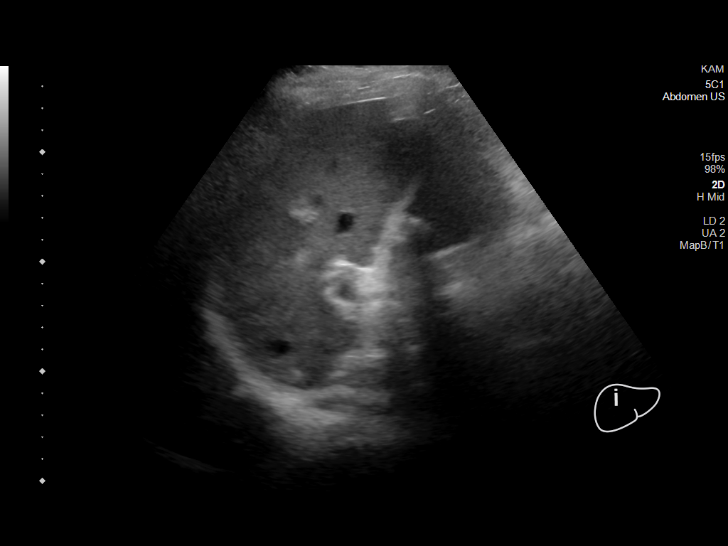
[im 63/76]
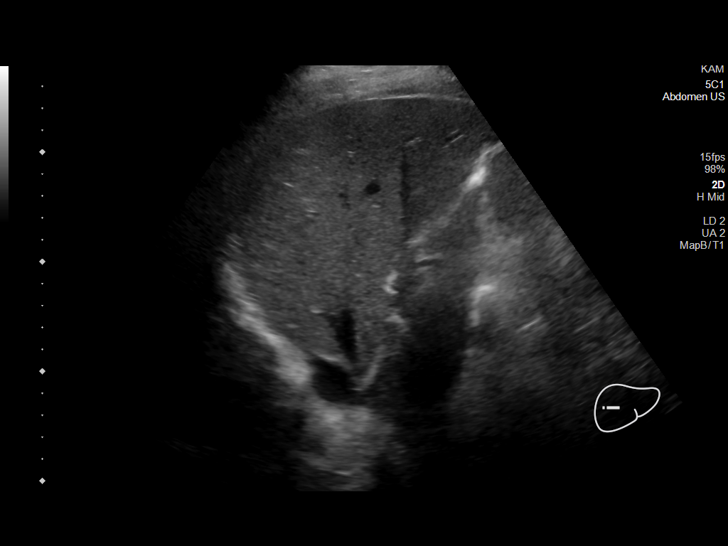
[im 69/76]
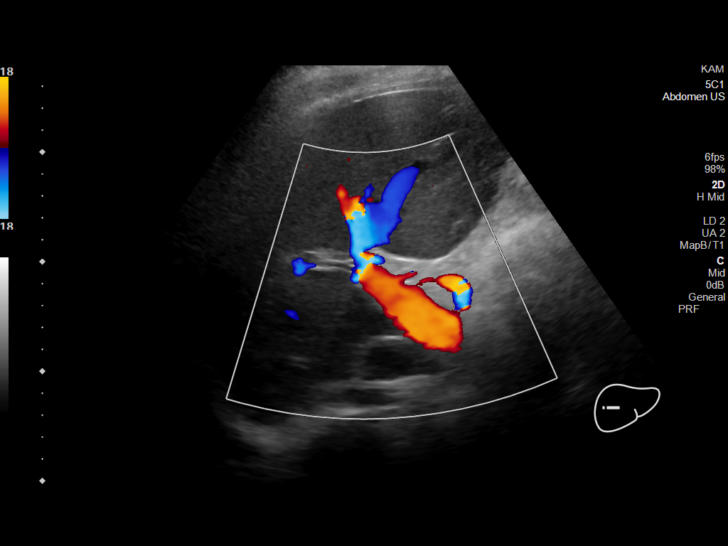
[im 76/76]
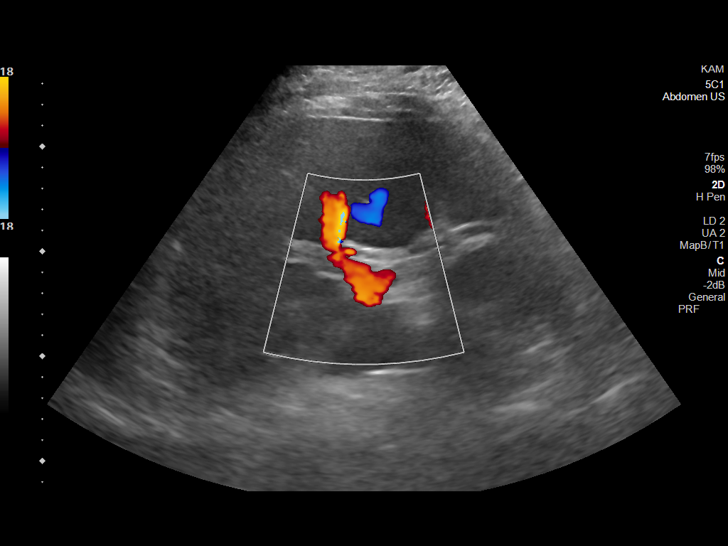

[14 of 25 positions shown; findings below may reference images not displayed]

FINDINGS: Gallbladder:

There is gallbladder wall thickening measuring 0.8 cm. There are
gallstones measuring up to 1.8 cm. There is sludge in the
gallbladder. Positive sonographic Murphy sign. Mild pericholecystic
fluid.

Common bile duct:

Diameter: 0.4 cm, within normal limits

Liver:

No focal lesion identified. Within normal limits in parenchymal
echogenicity. Portal vein is patent on color Doppler imaging with
normal direction of blood flow towards the liver.

Other: None.
IMPRESSION: Gallstones with gallbladder wall thickening and positive sonographic
Murphy sign. Findings are concerning for acute cholecystitis in the
appropriate clinical setting.

## 2023-01-30 ENCOUNTER — Other Ambulatory Visit: Payer: Self-pay

## 2023-01-30 ENCOUNTER — Emergency Department (HOSPITAL_BASED_OUTPATIENT_CLINIC_OR_DEPARTMENT_OTHER)
Admission: EM | Admit: 2023-01-30 | Discharge: 2023-01-30 | Disposition: A | Discharge status: Home / Self Care | Payer: No Typology Code available for payment source | Attending: Emergency Medicine | Admitting: Emergency Medicine

## 2023-01-30 ENCOUNTER — Encounter (HOSPITAL_BASED_OUTPATIENT_CLINIC_OR_DEPARTMENT_OTHER): Payer: Self-pay

## 2023-01-30 ENCOUNTER — Emergency Department (HOSPITAL_BASED_OUTPATIENT_CLINIC_OR_DEPARTMENT_OTHER): Payer: No Typology Code available for payment source | Admitting: Radiology

## 2023-01-30 DIAGNOSIS — S39012A Strain of muscle, fascia and tendon of lower back, initial encounter: Secondary | ICD-10-CM | POA: Insufficient documentation

## 2023-01-30 DIAGNOSIS — Y92410 Unspecified street and highway as the place of occurrence of the external cause: Secondary | ICD-10-CM | POA: Insufficient documentation

## 2023-01-30 DIAGNOSIS — M545 Low back pain, unspecified: Secondary | ICD-10-CM | POA: Diagnosis present

## 2023-01-30 MED ORDER — CYCLOBENZAPRINE HCL 10 MG PO TABS: 10.0000 mg | ORAL_TABLET | Freq: Two times a day (BID) | ORAL | 0 refills | Status: DC | PRN

## 2023-01-30 MED ORDER — CYCLOBENZAPRINE HCL 10 MG PO TABS: 10.0000 mg | ORAL_TABLET | Freq: Two times a day (BID) | ORAL | 0 refills | Status: AC | PRN

## 2023-01-30 NOTE — Discharge Instructions (Signed)
Begin taking ibuprofen 600 mg every 6 hours as needed for pain.  Begin taking Flexeril as needed for pain not relieved with ibuprofen.  Follow-up with your primary doctor if symptoms are not improving in the next week.
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
## 2023-01-30 NOTE — ED Notes (Signed)
Reviewed AVS with patient, patient expressed understanding of directions, denies further questions at this time.
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
